# Patient Record
Sex: Male | Born: 1985 | Race: Black or African American | Hispanic: No | Marital: Single | Smoking: Never smoker
Health system: Southern US, Community
[De-identification: ages and names within clinical notes are randomized; demographics above are authoritative.]

## PROBLEM LIST (undated history)

## (undated) HISTORY — PX: COSMETIC SURGERY: SHX468

---

## 2009-07-04 ENCOUNTER — Emergency Department (HOSPITAL_COMMUNITY): Admission: EM | Admit: 2009-07-04 | Discharge: 2009-07-04 | Payer: Self-pay | Admitting: Emergency Medicine

## 2010-04-27 ENCOUNTER — Emergency Department (HOSPITAL_COMMUNITY)
Admission: EM | Admit: 2010-04-27 | Discharge: 2010-04-27 | Disposition: A | Discharge status: Home / Self Care | Payer: Self-pay | Attending: Emergency Medicine | Admitting: Emergency Medicine

## 2010-04-27 DIAGNOSIS — R197 Diarrhea, unspecified: Secondary | ICD-10-CM | POA: Insufficient documentation

## 2010-04-27 DIAGNOSIS — R112 Nausea with vomiting, unspecified: Secondary | ICD-10-CM | POA: Insufficient documentation

## 2010-04-27 DIAGNOSIS — M549 Dorsalgia, unspecified: Secondary | ICD-10-CM | POA: Insufficient documentation

## 2010-04-27 LAB — URINALYSIS, ROUTINE W REFLEX MICROSCOPIC
Nitrite: NEGATIVE
Protein, ur: NEGATIVE mg/dL
Urine Glucose, Fasting: NEGATIVE mg/dL
Urobilinogen, UA: 4 mg/dL — ABNORMAL HIGH (ref 0.0–1.0)

## 2010-04-27 LAB — COMPREHENSIVE METABOLIC PANEL
ALT: 15 U/L (ref 0–53)
AST: 23 U/L (ref 0–37)
Alkaline Phosphatase: 45 U/L (ref 39–117)
BUN: 12 mg/dL (ref 6–23)
Creatinine, Ser: 1.1 mg/dL (ref 0.4–1.5)
GFR calc non Af Amer: >60 mL/min (ref >60)
Potassium: 4.3 mEq/L (ref 3.5–5.1)
Sodium: 138 mEq/L (ref 135–145)
Total Bilirubin: 0.6 mg/dL (ref 0.3–1.2)
Total Protein: 6.5 g/dL (ref 6.0–8.3)

## 2010-11-28 ENCOUNTER — Inpatient Hospital Stay (INDEPENDENT_AMBULATORY_CARE_PROVIDER_SITE_OTHER)
Admission: RE | Admit: 2010-11-28 | Discharge: 2010-11-28 | Disposition: A | Discharge status: Home / Self Care | Payer: Self-pay | Source: Ambulatory Visit | Attending: Emergency Medicine | Admitting: Emergency Medicine

## 2010-11-28 DIAGNOSIS — K047 Periapical abscess without sinus: Secondary | ICD-10-CM

## 2011-03-23 ENCOUNTER — Emergency Department (HOSPITAL_COMMUNITY)
Admission: AD | Admit: 2011-03-23 | Discharge: 2011-03-23 | Disposition: A | Discharge status: Home / Self Care | Payer: Self-pay

## 2011-03-23 ENCOUNTER — Emergency Department (INDEPENDENT_AMBULATORY_CARE_PROVIDER_SITE_OTHER)
Admission: EM | Admit: 2011-03-23 | Discharge: 2011-03-23 | Disposition: A | Discharge status: Home / Self Care | Payer: BC Managed Care – PPO | Source: Home / Self Care | Attending: Emergency Medicine | Admitting: Emergency Medicine

## 2011-03-23 DIAGNOSIS — K0401 Reversible pulpitis: Secondary | ICD-10-CM

## 2011-03-23 DIAGNOSIS — S025XXA Fracture of tooth (traumatic), initial encounter for closed fracture: Secondary | ICD-10-CM

## 2011-03-23 MED ORDER — TRAMADOL HCL 50 MG PO TABS: 50.0000 mg | ORAL_TABLET | Freq: Four times a day (QID) | ORAL | Status: AC | PRN

## 2011-03-23 MED ORDER — PENICILLIN V POTASSIUM 500 MG PO TABS: 500.0000 mg | ORAL_TABLET | Freq: Three times a day (TID) | ORAL | Status: AC

## 2011-03-23 MED ORDER — IBUPROFEN 600 MG PO TABS: 600.0000 mg | ORAL_TABLET | Freq: Three times a day (TID) | ORAL | Status: AC | PRN
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
## 2012-07-22 ENCOUNTER — Emergency Department (HOSPITAL_COMMUNITY)
Admission: EM | Admit: 2012-07-22 | Discharge: 2012-07-22 | Disposition: A | Discharge status: Home / Self Care | Payer: BC Managed Care – PPO | Source: Home / Self Care | Attending: Emergency Medicine | Admitting: Emergency Medicine

## 2012-07-22 ENCOUNTER — Ambulatory Visit (HOSPITAL_COMMUNITY)
Admit: 2012-07-22 | Discharge: 2012-07-22 | Disposition: A | Discharge status: Home / Self Care | Payer: BC Managed Care – PPO | Source: Ambulatory Visit | Attending: Emergency Medicine | Admitting: Emergency Medicine

## 2012-07-22 ENCOUNTER — Encounter (HOSPITAL_COMMUNITY): Payer: Self-pay | Admitting: Emergency Medicine

## 2012-07-22 DIAGNOSIS — M25539 Pain in unspecified wrist: Secondary | ICD-10-CM | POA: Insufficient documentation

## 2012-07-22 DIAGNOSIS — B081 Molluscum contagiosum: Secondary | ICD-10-CM

## 2012-07-22 DIAGNOSIS — S52599A Other fractures of lower end of unspecified radius, initial encounter for closed fracture: Secondary | ICD-10-CM | POA: Insufficient documentation

## 2012-07-22 DIAGNOSIS — S5292XA Unspecified fracture of left forearm, initial encounter for closed fracture: Secondary | ICD-10-CM

## 2012-07-22 DIAGNOSIS — S5290XA Unspecified fracture of unspecified forearm, initial encounter for closed fracture: Secondary | ICD-10-CM

## 2012-07-22 MED ORDER — TRETINOIN 0.1 % EX CREA: TOPICAL_CREAM | Freq: Every day | CUTANEOUS | Status: AC

## 2012-07-22 NOTE — ED Notes (Signed)
Patient transported to X-ray 
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
## 2012-07-22 NOTE — ED Notes (Signed)
Ortho Tech paged.

## 2012-07-22 NOTE — Progress Notes (Signed)
Orthopedic Tech Progress Note Patient Details:  Shane Liu 1986-02-19 [id number]  Ortho Devices Type of Ortho Device: Ace wrap;Sugartong splint;Arm sling Ortho Device/Splint Location: LUE Ortho Device/Splint Interventions: Ordered;Application   Jennye Moccasin 07/22/2012, 3:49 PM
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
## 2012-12-05 ENCOUNTER — Encounter (HOSPITAL_COMMUNITY): Payer: Self-pay

## 2012-12-05 ENCOUNTER — Emergency Department (HOSPITAL_COMMUNITY)
Admission: EM | Admit: 2012-12-05 | Discharge: 2012-12-05 | Disposition: A | Discharge status: Home / Self Care | Payer: BC Managed Care – PPO | Source: Home / Self Care | Attending: Family Medicine | Admitting: Family Medicine

## 2012-12-05 DIAGNOSIS — B356 Tinea cruris: Secondary | ICD-10-CM

## 2012-12-05 MED ORDER — TERBINAFINE HCL 250 MG PO TABS: 250.0000 mg | ORAL_TABLET | Freq: Every day | ORAL | Status: AC

## 2012-12-05 MED ORDER — CLOTRIMAZOLE-BETAMETHASONE 1-0.05 % EX CREA: TOPICAL_CREAM | CUTANEOUS | Status: AC

## 2012-12-05 NOTE — ED Notes (Signed)
Reported rash in groin x 2 months or more

## 2012-12-05 NOTE — ED Provider Notes (Signed)
CSN: [id number]     Arrival date & time 12/05/12  1228 History   First MD Initiated Contact with Patient 12/05/12 1443     Chief Complaint  Patient presents with  . Rash   (Consider location/radiation/quality/duration/timing/severity/associated sxs/prior Treatment) Patient is a 27 y.o. male presenting with rash.  Rash Pain severity:  No pain Onset quality:  Gradual Duration:  4 weeks Progression:  Unchanged Chronicity:  Chronic Context comment:  Groin rash not responding to otc meds. Ineffective treatments:  OTC medications Risk factors comment:  Had neg std check for rash sx.   History reviewed. No pertinent past medical history. Past Surgical History  Procedure Laterality Date  . Cosmetic surgery     History reviewed. No pertinent family history. History  Substance Use Topics  . Smoking status: Never Smoker   . Smokeless tobacco: Not on file  . Alcohol Use: Yes    Review of Systems  Constitutional: Negative.   Musculoskeletal: Negative.   Skin: Positive for rash.    Allergies  Review of patient's allergies indicates no known allergies.  Home Medications   Current Outpatient Rx  Name  Route  Sig  Dispense  Refill  . clotrimazole-betamethasone (LOTRISONE) cream      Apply to affected area 2 times daily prn   45 g   1   . terbinafine (LAMISIL) 250 MG tablet   Oral   Take 1 tablet (250 mg total) by mouth daily.   30 tablet   0   . tretinoin (RETIN-A) 0.1 % cream   Topical   Apply topically at bedtime.   45 g   0    BP 131/84  Pulse 66  Temp(Src) 97.8 F (36.6 C) (Oral)  Resp 16  SpO2 100% Physical Exam  Nursing note and vitals reviewed. Constitutional: He is oriented to person, place, and time. He appears well-developed and well-nourished.  Neurological: He is alert and oriented to person, place, and time.  Skin: Skin is warm and dry. Rash noted.  Dry bilat inguinal, scrotal perineal, rash.    ED Course  Procedures (including critical care  time) Labs Review Labs Reviewed - No data to display Imaging Review No results found.  MDM      Linna Hoff, MD 12/05/12 [phone]
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
## 2022-03-04 ENCOUNTER — Encounter: Payer: Self-pay | Admitting: Pharmacist

## 2022-08-09 ENCOUNTER — Encounter (HOSPITAL_BASED_OUTPATIENT_CLINIC_OR_DEPARTMENT_OTHER): Payer: Self-pay | Admitting: Emergency Medicine

## 2022-08-09 ENCOUNTER — Emergency Department (HOSPITAL_BASED_OUTPATIENT_CLINIC_OR_DEPARTMENT_OTHER): Payer: BLUE CROSS/BLUE SHIELD

## 2022-08-09 ENCOUNTER — Other Ambulatory Visit: Payer: Self-pay

## 2022-08-09 ENCOUNTER — Emergency Department (HOSPITAL_BASED_OUTPATIENT_CLINIC_OR_DEPARTMENT_OTHER)
Admission: EM | Admit: 2022-08-09 | Discharge: 2022-08-09 | Disposition: A | Discharge status: Home / Self Care | Payer: BLUE CROSS/BLUE SHIELD | Attending: Emergency Medicine | Admitting: Emergency Medicine

## 2022-08-09 DIAGNOSIS — N132 Hydronephrosis with renal and ureteral calculous obstruction: Secondary | ICD-10-CM | POA: Diagnosis not present

## 2022-08-09 DIAGNOSIS — N200 Calculus of kidney: Secondary | ICD-10-CM

## 2022-08-09 DIAGNOSIS — R109 Unspecified abdominal pain: Secondary | ICD-10-CM | POA: Diagnosis present

## 2022-08-09 LAB — CBC WITH DIFFERENTIAL/PLATELET
Abs Immature Granulocytes: 0.01 10*3/uL (ref 0.00–0.07)
Basophils Absolute: 0 10*3/uL (ref 0.0–0.1)
Basophils Relative: 0 %
Eosinophils Absolute: 0.2 10*3/uL (ref 0.0–0.5)
Eosinophils Relative: 3 %
HCT: 38.9 % — ABNORMAL LOW (ref 39.0–52.0)
Hemoglobin: 13.7 g/dL (ref 13.0–17.0)
Immature Granulocytes: 0 %
Lymphocytes Relative: 24 %
Lymphs Abs: 1.3 10*3/uL (ref 0.7–4.0)
MCH: 28.9 pg (ref 26.0–34.0)
MCHC: 35.2 g/dL (ref 30.0–36.0)
MCV: 82.1 fL (ref 80.0–100.0)
Monocytes Absolute: 0.5 10*3/uL (ref 0.1–1.0)
Monocytes Relative: 9 %
Neutro Abs: 3.3 10*3/uL (ref 1.7–7.7)
Neutrophils Relative %: 64 %
Platelets: 241 10*3/uL (ref 150–400)
RBC: 4.74 MIL/uL (ref 4.22–5.81)
RDW: 12.3 % (ref 11.5–15.5)
WBC: 5.3 10*3/uL (ref 4.0–10.5)
nRBC: 0 % (ref 0.0–0.2)

## 2022-08-09 LAB — URINALYSIS, ROUTINE W REFLEX MICROSCOPIC
Bacteria, UA: NONE SEEN
Bilirubin Urine: NEGATIVE
Glucose, UA: NEGATIVE mg/dL
Ketones, ur: NEGATIVE mg/dL
Leukocytes,Ua: NEGATIVE
Nitrite: NEGATIVE
Specific Gravity, Urine: 1.033 — ABNORMAL HIGH (ref 1.005–1.030)
pH: 5.5 (ref 5.0–8.0)

## 2022-08-09 LAB — BASIC METABOLIC PANEL
Anion gap: 7 (ref 5–15)
BUN: 24 mg/dL — ABNORMAL HIGH (ref 6–20)
CO2: 25 mmol/L (ref 22–32)
Calcium: 9.2 mg/dL (ref 8.9–10.3)
Chloride: 106 mmol/L (ref 98–111)
Creatinine, Ser: 1.88 mg/dL — ABNORMAL HIGH (ref 0.61–1.24)
GFR, Estimated: 47 mL/min — ABNORMAL LOW (ref >60)
Glucose, Bld: 100 mg/dL — ABNORMAL HIGH (ref 70–99)
Potassium: 4.3 mmol/L (ref 3.5–5.1)
Sodium: 138 mmol/L (ref 135–145)

## 2022-08-09 MED ORDER — SODIUM CHLORIDE 0.9 % IV BOLUS
500.0000 mL | Freq: Once | INTRAVENOUS | Status: AC
  Administered 2022-08-09: 500 mL via INTRAVENOUS

## 2022-08-09 MED ORDER — TAMSULOSIN HCL 0.4 MG PO CAPS: 0.4000 mg | ORAL_CAPSULE | Freq: Every day | ORAL | 0 refills | Status: DC

## 2022-08-09 MED ORDER — HYDROCODONE-ACETAMINOPHEN 5-325 MG PO TABS: 1.0000 | ORAL_TABLET | Freq: Four times a day (QID) | ORAL | 0 refills | Status: AC | PRN

## 2022-08-09 MED ORDER — ONDANSETRON HCL 4 MG PO TABS: 4.0000 mg | ORAL_TABLET | Freq: Three times a day (TID) | ORAL | 0 refills | Status: AC | PRN

## 2022-08-09 MED ORDER — KETOROLAC TROMETHAMINE 30 MG/ML IJ SOLN
30.0000 mg | Freq: Once | INTRAMUSCULAR | Status: AC
  Administered 2022-08-09: 30 mg via INTRAVENOUS

## 2022-08-09 MED ORDER — SODIUM CHLORIDE 0.9 % IV BOLUS
1000.0000 mL | Freq: Once | INTRAVENOUS | Status: AC
  Administered 2022-08-09: 1000 mL via INTRAVENOUS

## 2022-08-09 NOTE — ED Provider Notes (Signed)
[place] EMERGENCY DEPARTMENT AT [facility] Provider Note   CSN: [id number] Arrival date & time: 08/09/22  4098     History  Chief Complaint  Patient presents with   Back Pain    JOSAIAH SHEILS is a 37 y.o. male.  HPI   37 year old male presents emergency department with complaint for left lower back/flank pain.  Patient states he has had this on and off in the past.  However recently he was walking on uneven ground, missed a step and caught himself with his left leg.  Since then the left back pain has been worse.  Patient denies any midline spinal tenderness.  No numbness or weakness of the lower extremities.  Patient admits to drinking a decent amount alcohol and is concerned that this pain may be related to his kidneys or dehydration.  He denies any other genitourinary symptoms or history of kidney stones.  Home Medications Prior to Admission medications   Medication Sig Start Date End Date Taking? Authorizing Provider  clotrimazole-betamethasone (LOTRISONE) cream Apply to affected area 2 times daily prn 12/05/12   Linna Hoff, MD  terbinafine (LAMISIL) 250 MG tablet Take 1 tablet (250 mg total) by mouth daily. 12/05/12   Linna Hoff, MD  tretinoin (RETIN-A) 0.1 % cream Apply topically at bedtime. 07/22/12   Reuben Likes, MD      Allergies    Patient has no known allergies.    Review of Systems   Review of Systems  Constitutional:  Negative for fever.  Respiratory:  Negative for shortness of breath.   Cardiovascular:  Negative for chest pain.  Gastrointestinal:  Negative for abdominal pain, diarrhea and vomiting.  Genitourinary:  Positive for flank pain. Negative for dysuria, hematuria and urgency.  Musculoskeletal:  Positive for back pain.  Skin:  Negative for rash.  Neurological:  Negative for headaches.    Physical Exam Updated Vital Signs BP 131/82   Pulse 75   Temp 98.7 F (37.1 C) (Oral)   Resp 20   Ht 5\' 8"  (1.727 m)   Wt 87.1 kg   SpO2  98%   BMI 29.19 kg/m  Physical Exam Vitals and nursing note reviewed.  Constitutional:      Appearance: Normal appearance.  HENT:     Head: Normocephalic.     Mouth/Throat:     Mouth: Mucous membranes are moist.  Cardiovascular:     Rate and Rhythm: Normal rate.  Pulmonary:     Effort: Pulmonary effort is normal. No respiratory distress.  Abdominal:     Palpations: Abdomen is soft.  Musculoskeletal:     Comments: Left lower back/flank area slightly tender to palpation, no overlying skin changes  Skin:    General: Skin is warm.  Neurological:     Mental Status: He is alert and oriented to person, place, and time. Mental status is at baseline.  Psychiatric:        Mood and Affect: Mood normal.     ED Results / Procedures / Treatments   Labs (all labs ordered are listed, but only abnormal results are displayed) Labs Reviewed  URINALYSIS, ROUTINE W REFLEX MICROSCOPIC - Abnormal; Notable for the following components:      Result Value   Specific Gravity, Urine 1.033 (*)    Hgb urine dipstick LARGE (*)    Protein, ur TRACE (*)    All other components within normal limits  CBC WITH DIFFERENTIAL/PLATELET  BASIC METABOLIC PANEL    EKG None  Radiology No results found.  Procedures Procedures    Medications Ordered in ED Medications  sodium chloride 0.9 % bolus 1,000 mL (has no administration in time range)  ketorolac (TORADOL) 30 MG/ML injection 30 mg (has no administration in time range)    ED Course/ Medical Decision Making/ A&P                             Medical Decision Making Amount and/or Complexity of Data Reviewed Labs: ordered. Radiology: ordered.  Risk Prescription drug management.   37 year old male presents emergency department left lower back/flank pain.  He believes that this worsened after missing a step and straining his lower back.  However he reveals that this pain has been intermittent and ongoing for a while.  Urinalysis shows a  large amount of blood.  For this reason further workup was pursued.  CT confirms a 3 mm stone in the left mid ureter with mild hydronephrosis.  Blood work shows no leukocytosis, there is a mild AKI but otherwise the urinalysis shows no infection.  After IV medicine and fluids patient feels improved.  Discussed the importance of oral rehydration given the mild AKI and he understands.  Educated the patient on outpatient regimen for stone passage.  Will prescribe medicine for symptomatic treatment and stone passage.  Patient at this time appears safe and stable for discharge and close outpatient follow up. Discharge plan and strict return to ED precautions discussed, patient verbalizes understanding and agreement.        Final Clinical Impression(s) / ED Diagnoses Final diagnoses:  None    Rx / DC Orders ED Discharge Orders     None         Rozelle Logan, DO 08/09/22 1222

## 2022-08-09 NOTE — Discharge Instructions (Signed)
You have been seen and discharged from the emergency department.  You have been diagnosed with a left-sided kidney stone.  You were given IV medicine and fluids.  The best way to pass the stone and avoid further stones is to stay well-hydrated with water.  Your urine should be a clear yellow.  Also avoiding high salt diet may help decrease the production of kidney stones.  Take ibuprofen as needed for pain control.  Take stronger pain medicine as needed.  Do not mix this medication with alcohol or other sedating medications. Do not drive or do heavy physical activity until you know how this medication affects you.  It may cause drowsiness.  You have been prescribed nausea medicine if needed as well as a medication called Flomax to aid in passing of the kidney stone.  Follow-up with your primary provider for further evaluation and further care. Take home medications as prescribed. If you have any worsening symptoms, worsening side pain, fever/chills, nausea/vomiting or further concerns for your health please return to an emergency department for further evaluation.

## 2022-08-09 NOTE — ED Triage Notes (Signed)
Lower left back/flank pain for 2 days. 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 He states he is worried it may be his kidneys. No pain with urination. No problems urinating, no fevers

## 2022-08-09 NOTE — ED Notes (Signed)
Patient transported to CT 

## 2022-08-11 ENCOUNTER — Other Ambulatory Visit: Payer: Self-pay

## 2022-08-11 ENCOUNTER — Encounter (HOSPITAL_BASED_OUTPATIENT_CLINIC_OR_DEPARTMENT_OTHER): Payer: Self-pay

## 2022-08-11 DIAGNOSIS — R109 Unspecified abdominal pain: Secondary | ICD-10-CM | POA: Diagnosis present

## 2022-08-11 DIAGNOSIS — N23 Unspecified renal colic: Secondary | ICD-10-CM | POA: Diagnosis not present

## 2022-08-11 LAB — CBC
HCT: 38 % — ABNORMAL LOW (ref 39.0–52.0)
Hemoglobin: 13.2 g/dL (ref 13.0–17.0)
MCH: 28.3 pg (ref 26.0–34.0)
MCHC: 34.7 g/dL (ref 30.0–36.0)
MCV: 81.5 fL (ref 80.0–100.0)
Platelets: 229 10*3/uL (ref 150–400)
RBC: 4.66 MIL/uL (ref 4.22–5.81)
RDW: 12 % (ref 11.5–15.5)
WBC: 6.1 10*3/uL (ref 4.0–10.5)
nRBC: 0 % (ref 0.0–0.2)

## 2022-08-11 LAB — BASIC METABOLIC PANEL
Anion gap: 9 (ref 5–15)
BUN: 17 mg/dL (ref 6–20)
CO2: 24 mmol/L (ref 22–32)
Calcium: 9.3 mg/dL (ref 8.9–10.3)
Chloride: 103 mmol/L (ref 98–111)
Creatinine, Ser: 1.48 mg/dL — ABNORMAL HIGH (ref 0.61–1.24)
GFR, Estimated: >60 mL/min (ref >60)
Glucose, Bld: 113 mg/dL — ABNORMAL HIGH (ref 70–99)
Potassium: 4 mmol/L (ref 3.5–5.1)
Sodium: 136 mmol/L (ref 135–145)

## 2022-08-11 LAB — URINALYSIS, ROUTINE W REFLEX MICROSCOPIC
Bacteria, UA: NONE SEEN
Bilirubin Urine: NEGATIVE
Glucose, UA: NEGATIVE mg/dL
Ketones, ur: NEGATIVE mg/dL
Leukocytes,Ua: NEGATIVE
Nitrite: NEGATIVE
Protein, ur: NEGATIVE mg/dL
Specific Gravity, Urine: 1.02 (ref 1.005–1.030)
pH: 6 (ref 5.0–8.0)

## 2022-08-11 NOTE — ED Triage Notes (Signed)
Patient here POV from Home.  Endorses left Sided Flank Pain for 4 Days. Seen 2 Days ago and diagnosed with a Renal Stone. Seeks Re-Evaluation as Pain has worsened.   No N/V/D. No Fevers. No Dysuria.   NAD Noted during Triage. A&Ox4. GCS 15. Ambulatory.

## 2022-08-12 ENCOUNTER — Emergency Department (HOSPITAL_BASED_OUTPATIENT_CLINIC_OR_DEPARTMENT_OTHER)
Admission: EM | Admit: 2022-08-12 | Discharge: 2022-08-12 | Disposition: A | Discharge status: Home / Self Care | Payer: BLUE CROSS/BLUE SHIELD | Attending: Emergency Medicine | Admitting: Emergency Medicine

## 2022-08-12 DIAGNOSIS — N23 Unspecified renal colic: Secondary | ICD-10-CM

## 2022-08-12 MED ORDER — KETOROLAC TROMETHAMINE 30 MG/ML IJ SOLN
15.0000 mg | Freq: Once | INTRAMUSCULAR | Status: AC
  Administered 2022-08-12: 15 mg via INTRAVENOUS
  Filled 2022-08-12: qty 1

## 2022-08-12 NOTE — ED Provider Notes (Signed)
[place] EMERGENCY DEPARTMENT AT [facility]  Provider Note  CSN: [id number] Arrival date & time: 08/11/22 2230  History Chief Complaint  Patient presents with   Flank Pain    Shane Liu is a 37 y.o. male with known 3mm ureteral stone found on ED visit 5/26 for flank pain reports continued pain off and on since then, not well controlled with prescribed medications. No dysuria or fever.    Home Medications Prior to Admission medications   Medication Sig Start Date End Date Taking? Authorizing Provider  clotrimazole-betamethasone (LOTRISONE) cream Apply to affected area 2 times daily prn 12/05/12   Linna Hoff, MD  HYDROcodone-acetaminophen (NORCO) 5-325 MG tablet Take 1 tablet by mouth every 6 (six) hours as needed for moderate pain. 08/09/22   Horton, Clabe Seal, DO  ondansetron (ZOFRAN) 4 MG tablet Take 1 tablet (4 mg total) by mouth every 8 (eight) hours as needed for nausea or vomiting. 08/09/22   Horton, Clabe Seal, DO  tamsulosin (FLOMAX) 0.4 MG CAPS capsule Take 1 capsule (0.4 mg total) by mouth daily. 08/09/22   Horton, Clabe Seal, DO  terbinafine (LAMISIL) 250 MG tablet Take 1 tablet (250 mg total) by mouth daily. 12/05/12   Linna Hoff, MD  tretinoin (RETIN-A) 0.1 % cream Apply topically at bedtime. 07/22/12   Reuben Likes, MD     Allergies    Patient has no known allergies.   Review of Systems   Review of Systems Please see HPI for pertinent positives and negatives  Physical Exam BP (!) 129/99 (BP Location: Right Arm)   Pulse 67   Temp 97.8 F (36.6 C)   Resp 20   Ht 5\' 8"  (1.727 m)   Wt 87.1 kg   SpO2 98%   BMI 29.20 kg/m   Physical Exam Vitals and nursing note reviewed.  Constitutional:      Appearance: Normal appearance.  HENT:     Head: Normocephalic and atraumatic.     Nose: Nose normal.     Mouth/Throat:     Mouth: Mucous membranes are moist.  Eyes:     Extraocular Movements: Extraocular movements intact.      Conjunctiva/sclera: Conjunctivae normal.  Cardiovascular:     Rate and Rhythm: Normal rate.  Pulmonary:     Effort: Pulmonary effort is normal.     Breath sounds: Normal breath sounds.  Abdominal:     General: Abdomen is flat.     Palpations: Abdomen is soft.     Tenderness: There is no abdominal tenderness.  Musculoskeletal:        General: No swelling. Normal range of motion.     Cervical back: Neck supple.  Skin:    General: Skin is warm and dry.  Neurological:     General: No focal deficit present.     Mental Status: He is alert.  Psychiatric:        Mood and Affect: Mood normal.     ED Results / Procedures / Treatments   EKG None  Procedures Procedures  Medications Ordered in the ED Medications  ketorolac (TORADOL) 30 MG/ML injection 15 mg (15 mg Intravenous Given 08/12/22 0058)    Initial Impression and Plan  Patient here with persistent renal colic. Labs done in triage show BMP with mildly increased Cr, improved from recent visit. CBC is normal. UA with blood, no infection. Will give a dose of Toradol and reassess for symptom improvement.   ED Course   Clinical Course  as of 08/12/22 0200  Wed Aug 12, 2022  0158 Patient reports pain is resolved. Given urine strainer as he does not have one at home. He has enough norco to last another day or two, recommend he follow up with Urology if he has not passed his stone in the next 48 hrs. RTED if symptoms worsen.  [CS]    Clinical Course User Index [CS] Pollyann Savoy, MD     MDM Rules/Calculators/A&P Medical Decision Making Problems Addressed: Renal colic: acute illness or injury  Amount and/or Complexity of Data Reviewed Labs: ordered. Decision-making details documented in ED Course.  Risk Prescription drug management.     Final Clinical Impression(s) / ED Diagnoses Final diagnoses:  Renal colic    Rx / DC Orders ED Discharge Orders     None        Pollyann Savoy, MD 08/12/22  0200

## 2022-08-25 ENCOUNTER — Emergency Department (HOSPITAL_BASED_OUTPATIENT_CLINIC_OR_DEPARTMENT_OTHER): Payer: BLUE CROSS/BLUE SHIELD

## 2022-08-25 ENCOUNTER — Other Ambulatory Visit: Payer: Self-pay

## 2022-08-25 ENCOUNTER — Encounter (HOSPITAL_BASED_OUTPATIENT_CLINIC_OR_DEPARTMENT_OTHER): Payer: Self-pay

## 2022-08-25 ENCOUNTER — Emergency Department (HOSPITAL_BASED_OUTPATIENT_CLINIC_OR_DEPARTMENT_OTHER)
Admission: EM | Admit: 2022-08-25 | Discharge: 2022-08-25 | Disposition: A | Discharge status: Home / Self Care | Payer: BLUE CROSS/BLUE SHIELD | Attending: Emergency Medicine | Admitting: Emergency Medicine

## 2022-08-25 DIAGNOSIS — R1032 Left lower quadrant pain: Secondary | ICD-10-CM | POA: Diagnosis present

## 2022-08-25 DIAGNOSIS — N200 Calculus of kidney: Secondary | ICD-10-CM | POA: Insufficient documentation

## 2022-08-25 DIAGNOSIS — R319 Hematuria, unspecified: Secondary | ICD-10-CM

## 2022-08-25 LAB — CBC WITH DIFFERENTIAL/PLATELET
Abs Immature Granulocytes: 0.01 10*3/uL (ref 0.00–0.07)
Basophils Absolute: 0 10*3/uL (ref 0.0–0.1)
Basophils Relative: 1 %
Eosinophils Absolute: 0.2 10*3/uL (ref 0.0–0.5)
Eosinophils Relative: 2 %
HCT: 39.6 % (ref 39.0–52.0)
Hemoglobin: 13.9 g/dL (ref 13.0–17.0)
Immature Granulocytes: 0 %
Lymphocytes Relative: 31 %
Lymphs Abs: 2.1 10*3/uL (ref 0.7–4.0)
MCH: 28.2 pg (ref 26.0–34.0)
MCHC: 35.1 g/dL (ref 30.0–36.0)
MCV: 80.3 fL (ref 80.0–100.0)
Monocytes Absolute: 0.4 10*3/uL (ref 0.1–1.0)
Monocytes Relative: 6 %
Neutro Abs: 4 10*3/uL (ref 1.7–7.7)
Neutrophils Relative %: 60 %
Platelets: 352 10*3/uL (ref 150–400)
RBC: 4.93 MIL/uL (ref 4.22–5.81)
RDW: 11.9 % (ref 11.5–15.5)
WBC: 6.7 10*3/uL (ref 4.0–10.5)
nRBC: 0 % (ref 0.0–0.2)

## 2022-08-25 LAB — URINALYSIS, ROUTINE W REFLEX MICROSCOPIC
Bacteria, UA: NONE SEEN
Bilirubin Urine: NEGATIVE
Glucose, UA: NEGATIVE mg/dL
Ketones, ur: NEGATIVE mg/dL
Leukocytes,Ua: NEGATIVE
Nitrite: NEGATIVE
RBC / HPF: >50 RBC/hpf (ref 0–5)
Specific Gravity, Urine: 1.028 (ref 1.005–1.030)
pH: 5.5 (ref 5.0–8.0)

## 2022-08-25 LAB — COMPREHENSIVE METABOLIC PANEL
ALT: 15 U/L (ref 0–44)
AST: 16 U/L (ref 15–41)
Albumin: 4.7 g/dL (ref 3.5–5.0)
Alkaline Phosphatase: 50 U/L (ref 38–126)
Anion gap: 10 (ref 5–15)
BUN: 22 mg/dL — ABNORMAL HIGH (ref 6–20)
CO2: 25 mmol/L (ref 22–32)
Calcium: 9.8 mg/dL (ref 8.9–10.3)
Chloride: 106 mmol/L (ref 98–111)
Creatinine, Ser: 1.45 mg/dL — ABNORMAL HIGH (ref 0.61–1.24)
GFR, Estimated: >60 mL/min (ref >60)
Glucose, Bld: 112 mg/dL — ABNORMAL HIGH (ref 70–99)
Potassium: 3.7 mmol/L (ref 3.5–5.1)
Sodium: 141 mmol/L (ref 135–145)
Total Bilirubin: 0.4 mg/dL (ref 0.3–1.2)
Total Protein: 7.6 g/dL (ref 6.5–8.1)

## 2022-08-25 MED ORDER — SODIUM CHLORIDE 0.9 % IV BOLUS
1000.0000 mL | Freq: Once | INTRAVENOUS | Status: AC
  Administered 2022-08-25: 1000 mL via INTRAVENOUS

## 2022-08-25 MED ORDER — TAMSULOSIN HCL 0.4 MG PO CAPS: 0.4000 mg | ORAL_CAPSULE | Freq: Every day | ORAL | 0 refills | Status: AC

## 2022-08-25 NOTE — ED Provider Notes (Signed)
[place] EMERGENCY DEPARTMENT AT [facility] Provider Note   CSN: [id number] Arrival date & time: 08/25/22  2128     History  Chief Complaint  Patient presents with   Flank Pain    Shane Liu is a 37 y.o. male here presenting with dark urine.  Patient was seen on 5/26 and was diagnosed with 3 mm left mid ureteral stone.  Patient came back on 5/29 with persistent pain and was given pain medicine.  Patient states that the pain has improved.  However today he noticed sharp left lower quadrant pain and then dark urine.  He states that he did not take any pain medicine and is not in significant pain right now.  He was not sure if he passed a kidney stone or not.  The history is provided by the patient.       Home Medications Prior to Admission medications   Medication Sig Start Date End Date Taking? Authorizing Provider  clotrimazole-betamethasone (LOTRISONE) cream Apply to affected area 2 times daily prn 12/05/12   Linna Hoff, MD  HYDROcodone-acetaminophen (NORCO) 5-325 MG tablet Take 1 tablet by mouth every 6 (six) hours as needed for moderate pain. 08/09/22   Horton, Clabe Seal, DO  ondansetron (ZOFRAN) 4 MG tablet Take 1 tablet (4 mg total) by mouth every 8 (eight) hours as needed for nausea or vomiting. 08/09/22   Horton, Clabe Seal, DO  tamsulosin (FLOMAX) 0.4 MG CAPS capsule Take 1 capsule (0.4 mg total) by mouth daily. 08/09/22   Horton, Clabe Seal, DO  terbinafine (LAMISIL) 250 MG tablet Take 1 tablet (250 mg total) by mouth daily. 12/05/12   Linna Hoff, MD  tretinoin (RETIN-A) 0.1 % cream Apply topically at bedtime. 07/22/12   Reuben Likes, MD      Allergies    Patient has no known allergies.    Review of Systems   Review of Systems  Gastrointestinal:  Positive for abdominal pain.  All other systems reviewed and are negative.   Physical Exam Updated Vital Signs BP (!) 138/92 (BP Location: Right Arm)   Pulse 88   Temp 98 F (36.7 C) (Oral)   Resp  18   Ht 5\' 8"  (1.727 m)   Wt 86.2 kg   SpO2 97%   BMI 28.89 kg/m  Physical Exam Vitals and nursing note reviewed.  Constitutional:      Appearance: Normal appearance.     Comments: Patient appears comfortable and not in acute distress  HENT:     Head: Normocephalic.     Nose: Nose normal.     Mouth/Throat:     Mouth: Mucous membranes are moist.  Eyes:     Extraocular Movements: Extraocular movements intact.     Pupils: Pupils are equal, round, and reactive to light.  Cardiovascular:     Rate and Rhythm: Normal rate and regular rhythm.     Pulses: Normal pulses.     Heart sounds: Normal heart sounds.  Pulmonary:     Effort: Pulmonary effort is normal.     Breath sounds: Normal breath sounds.  Abdominal:     General: Abdomen is flat.     Palpations: Abdomen is soft.     Comments: Mild suprapubic tenderness and no CVA tenderness  Musculoskeletal:        General: Normal range of motion.     Cervical back: Normal range of motion and neck supple.  Skin:    General: Skin is warm.  Capillary Refill: Capillary refill takes less than 2 seconds.  Neurological:     General: No focal deficit present.     Mental Status: He is alert and oriented to person, place, and time.  Psychiatric:        Mood and Affect: Mood normal.        Behavior: Behavior normal.     ED Results / Procedures / Treatments   Labs (all labs ordered are listed, but only abnormal results are displayed) Labs Reviewed  URINALYSIS, ROUTINE W REFLEX MICROSCOPIC  CBC WITH DIFFERENTIAL/PLATELET  COMPREHENSIVE METABOLIC PANEL    EKG None  Radiology No results found.  Procedures Procedures    Medications Ordered in ED Medications  sodium chloride 0.9 % bolus 1,000 mL (1,000 mLs Intravenous New Bag/Given 08/25/22 2155)    ED Course/ Medical Decision Making/ A&P                             Medical Decision Making SAVIEN MAMULA is a 37 y.o. male here presenting with dark urine.  I suspect that  he may have passed a kidney stone.  Patient has no abdominal tenderness right now.  Will get a KUB to see if his stone is still there.  Will check kidney function and urinalysis.  Will hydrate and reassess.  11:19 PM I reviewed patient's labs and independently interpreted x-ray.  Patient's urinalysis showed large amount of blood but no infection.  Kidney function is baseline.  X-ray showed possible distal ureteral versus bladder stone.  Patient is not in any pain.  Will give several days of Flomax.  At this point patient stable for discharge.  Problems Addressed: Hematuria, unspecified type: acute illness or injury Kidney stone: acute illness or injury  Amount and/or Complexity of Data Reviewed Labs: ordered. Decision-making details documented in ED Course. Radiology: ordered and independent interpretation performed. Decision-making details documented in ED Course.    Final Clinical Impression(s) / ED Diagnoses Final diagnoses:  None    Rx / DC Orders ED Discharge Orders     None         Charlynne Pander, MD 08/25/22 2320

## 2022-08-25 NOTE — ED Notes (Signed)
RN reviewed discharge instructions with pt. Pt verbalized understanding and had no further questions. VSS upon discharge.  

## 2022-08-25 NOTE — Discharge Instructions (Addendum)
As we discussed, you have blood in your urine likely from the kidney stone.  Your kidney stone is right in your bladder or next to your bladder right now.  I recommend you take Flomax for the next week  You can take some Motrin if you have pain  Follow-up with urology  Return to ER if you have severe abdominal pain or vomiting or fever or trouble urinating

## 2022-08-25 NOTE — ED Triage Notes (Signed)
Patient here POV from Home.  Endorses Spasms to Lower Left Back that has been present for 24 Hours. A few hours ago noted to have darker brown/yellow urine. Minimal Output as well. No N/V/D. No Fevers. Recent Renal Stone.  NAD Noted during Triage. A&OX4. GCS 15. Ambulatory.
